# Patient Record
Sex: Male | Born: 1975 | Race: White | Hispanic: No | Marital: Married | Smoking: Former smoker
Health system: Southern US, Community
[De-identification: ages and names within clinical notes are randomized; demographics above are authoritative.]

## PROBLEM LIST (undated history)

## (undated) DIAGNOSIS — Z8619 Personal history of other infectious and parasitic diseases: Secondary | ICD-10-CM

## (undated) DIAGNOSIS — K219 Gastro-esophageal reflux disease without esophagitis: Secondary | ICD-10-CM

## (undated) HISTORY — DX: Personal history of other infectious and parasitic diseases: Z86.19

## (undated) HISTORY — DX: Gastro-esophageal reflux disease without esophagitis: K21.9

## (undated) HISTORY — PX: VASECTOMY: SHX75

---

## 2004-02-13 HISTORY — PX: FRACTURE SURGERY: SHX138

## 2005-02-11 ENCOUNTER — Observation Stay (HOSPITAL_COMMUNITY): Admission: EM | Admit: 2005-02-11 | Discharge: 2005-02-11 | Payer: Self-pay | Admitting: Emergency Medicine

## 2011-04-11 ENCOUNTER — Other Ambulatory Visit: Payer: Self-pay | Admitting: *Deleted

## 2013-08-13 ENCOUNTER — Encounter: Payer: Self-pay | Admitting: Family Medicine

## 2013-08-13 ENCOUNTER — Ambulatory Visit (INDEPENDENT_AMBULATORY_CARE_PROVIDER_SITE_OTHER): Payer: BC Managed Care – PPO | Admitting: Family Medicine

## 2013-08-13 VITALS — BP 122/86 | HR 91 | Temp 98.8°F | Ht 72.0 in | Wt 200.5 lb

## 2013-08-13 DIAGNOSIS — M533 Sacrococcygeal disorders, not elsewhere classified: Secondary | ICD-10-CM

## 2013-08-13 DIAGNOSIS — K219 Gastro-esophageal reflux disease without esophagitis: Secondary | ICD-10-CM

## 2013-08-13 MED ORDER — OMEPRAZOLE 20 MG PO CPDR: 20.0000 mg | DELAYED_RELEASE_CAPSULE | Freq: Every day | ORAL | Status: DC

## 2013-08-13 NOTE — Progress Notes (Signed)
Pre visit review using our clinic review tool, if applicable. No additional management support is needed unless otherwise documented below in the visit note.  ~3 months of pain near the tailbone.  Can get better with repositioning.  Prolonged sitting at work.  Worse as the workdays goes on.  Variable amount of pain.  Pain is superior to the rectum.  He doesn't think it is point tender.  No rash.  Midline pain.  Sitting on horseshoe pillow helps.  No hemorrhoids.  No blood in stool.  No abnormal stools.  No pain in legs.  No burning with urination.  No diarrhea, no fevers.  Not taking any nsaids.    "Heartburn".  Clearly better with PPI.  Worse when comes off.  Some burning in his throat.  Some sensation of gurgling in his throat.  Occ taste changes.  Voice can get irritated.  Going on for years.  Prev would use PPI rarely with relief, now using it more often during the past year.    Meds, vitals, and allergies reviewed.   ROS: See HPI.  Otherwise, noncontributory.  GEN: nad, alert and oriented HEENT: mucous membranes moist NECK: supple w/o LA CV: rrr.  PULM: ctab, no inc wob ABD: soft, +bs EXT: no edema SKIN: no acute rash Gait wnl Slightly ttp at the coccyx in midline, clearly superior to the rectum.

## 2013-08-13 NOTE — Patient Instructions (Signed)
Use the horseshoe pillow consistently, tylenol for pain. If not better, then notify me.   Prilosec daily, taper off coffee and caffeine, limit alcohol.  Elevate the head of your bed.  You can try to taper off the prilosec in about 2 months, going to every other day, then every 3rd day.  Take care.

## 2013-08-16 DIAGNOSIS — K219 Gastro-esophageal reflux disease without esophagitis: Secondary | ICD-10-CM | POA: Insufficient documentation

## 2013-08-16 DIAGNOSIS — M533 Sacrococcygeal disorders, not elsewhere classified: Secondary | ICD-10-CM | POA: Insufficient documentation

## 2013-08-16 NOTE — Assessment & Plan Note (Signed)
Benign exam, no red flag sx.  D/w pt.  If continued pain with persistent use of pillow, then we can image and w/u but this should resolve in the meantime.  He agrees.  No need to image today.

## 2013-08-16 NOTE — Assessment & Plan Note (Signed)
Drinking beer and coffee, head of bed not elevated. With sx of LPR with voice/throat irritation off PPI. Continue PPI for now, taper caffeine and etoh, elevate head of bed, report back as needed. He agrees.  Will try to wean off PPI as lifestyle changes made.  OP exam wnl, no LA on exam.

## 2013-09-24 ENCOUNTER — Ambulatory Visit (INDEPENDENT_AMBULATORY_CARE_PROVIDER_SITE_OTHER)
Admission: RE | Admit: 2013-09-24 | Discharge: 2013-09-24 | Disposition: A | Discharge status: Home / Self Care | Payer: BC Managed Care – PPO | Source: Ambulatory Visit | Attending: Family Medicine | Admitting: Family Medicine

## 2013-09-24 ENCOUNTER — Encounter: Payer: Self-pay | Admitting: Family Medicine

## 2013-09-24 ENCOUNTER — Ambulatory Visit (INDEPENDENT_AMBULATORY_CARE_PROVIDER_SITE_OTHER): Payer: BC Managed Care – PPO | Admitting: Family Medicine

## 2013-09-24 VITALS — BP 128/88 | HR 79 | Temp 98.0°F | Wt 205.2 lb

## 2013-09-24 DIAGNOSIS — M533 Sacrococcygeal disorders, not elsewhere classified: Secondary | ICD-10-CM

## 2013-09-24 NOTE — Progress Notes (Signed)
Pre visit review using our clinic review tool, if applicable. No additional management support is needed unless otherwise documented below in the visit note.  Down to QOD PPI and doing well.  No new injury but tailbone pain continues.  Using a pillow, has been careful, but still with pain.  Pain is similar to prev, in the same location.  No fevers, vomiting, blood in stool. Less pain in AM, more at the day goes on.   Meds, vitals, and allergies reviewed.   ROS: See HPI.  Otherwise, noncontributory.  nad ttp in midline w/o mass noted, superior to the rectum.

## 2013-09-24 NOTE — Patient Instructions (Signed)
Tylenol vs ibuprofen as needed. Ibuprofen with food; if may make the heartburn worse.  Continue with the pillow.  Activity as tolerated.

## 2013-09-25 NOTE — Assessment & Plan Note (Signed)
Images reviewed, d/w pt.  See notes on imaging.  fx noted.  Supportive care. No change in mgmt. Should slowly resolve.  F/u prn.

## 2013-12-01 ENCOUNTER — Encounter: Payer: Self-pay | Admitting: Family Medicine

## 2013-12-01 ENCOUNTER — Ambulatory Visit (INDEPENDENT_AMBULATORY_CARE_PROVIDER_SITE_OTHER): Payer: BC Managed Care – PPO | Admitting: Family Medicine

## 2013-12-01 ENCOUNTER — Encounter (INDEPENDENT_AMBULATORY_CARE_PROVIDER_SITE_OTHER): Payer: Self-pay

## 2013-12-01 VITALS — BP 120/80 | HR 86 | Temp 98.0°F | Ht 72.0 in | Wt 205.0 lb

## 2013-12-01 DIAGNOSIS — Z23 Encounter for immunization: Secondary | ICD-10-CM

## 2013-12-01 DIAGNOSIS — Z7189 Other specified counseling: Secondary | ICD-10-CM

## 2013-12-01 DIAGNOSIS — Z131 Encounter for screening for diabetes mellitus: Secondary | ICD-10-CM

## 2013-12-01 DIAGNOSIS — Z13220 Encounter for screening for lipoid disorders: Secondary | ICD-10-CM

## 2013-12-01 DIAGNOSIS — Z0000 Encounter for general adult medical examination without abnormal findings: Secondary | ICD-10-CM

## 2013-12-01 NOTE — Progress Notes (Signed)
Pre visit review using our clinic review tool, if applicable. No additional management support is needed unless otherwise documented below in the visit note.  CPE- See plan.  Routine anticipatory guidance given to patient.  See health maintenance. Tetanus <10 years per patient, he'll check his old records and notify me.   Flu shot d/w pt.  Done today.  Prostate and colon cancer screening not due.  Diet and exercise d/w pt.  Working on diet, cut back on calories, less fast food.  Exercise is limited.  D/w pt.  Encouraged.  Has a treadmill at home.   Living will d/w pt.  Wife designated if patient were incapacitated.  Due for screening labs, will return when fasting.   PMH and SH reviewed  Meds, vitals, and allergies reviewed.   ROS: See HPI.  Otherwise negative.    GEN: nad, alert and oriented HEENT: mucous membranes moist NECK: supple w/o LA CV: rrr. PULM: ctab, no inc wob ABD: soft, +bs EXT: no edema SKIN: no acute rash

## 2013-12-01 NOTE — Patient Instructions (Signed)
Take care.  Glad to see you.   Check your records about a tetanus shot.  Notify me with a date/year.  Keep working on [employer] Holdingsyour diet.  Try to exercise a little more.  Return for fasting labs.

## 2013-12-02 ENCOUNTER — Other Ambulatory Visit (INDEPENDENT_AMBULATORY_CARE_PROVIDER_SITE_OTHER): Payer: BC Managed Care – PPO

## 2013-12-02 DIAGNOSIS — Z131 Encounter for screening for diabetes mellitus: Secondary | ICD-10-CM

## 2013-12-02 DIAGNOSIS — Z0000 Encounter for general adult medical examination without abnormal findings: Secondary | ICD-10-CM | POA: Insufficient documentation

## 2013-12-02 DIAGNOSIS — Z7189 Other specified counseling: Secondary | ICD-10-CM | POA: Insufficient documentation

## 2013-12-02 DIAGNOSIS — Z13220 Encounter for screening for lipoid disorders: Secondary | ICD-10-CM

## 2013-12-02 LAB — LIPID PANEL
Cholesterol: 207 mg/dL — ABNORMAL HIGH (ref 0–200)
HDL: 40.3 mg/dL (ref >39.00)
NonHDL: 166.7
Total CHOL/HDL Ratio: 5
Triglycerides: 507 mg/dL — ABNORMAL HIGH (ref 0.0–149.0)
VLDL: 101.4 mg/dL — ABNORMAL HIGH (ref 0.0–40.0)

## 2013-12-02 LAB — GLUCOSE, RANDOM: Glucose, Bld: 99 mg/dL (ref 70–99)

## 2013-12-02 NOTE — Assessment & Plan Note (Signed)
Routine anticipatory guidance given to patient.  See health maintenance. Tetanus <10 years per patient, he'll check his old records and notify me.   Flu shot d/w pt.  Done today.  Prostate and colon cancer screening not due.  Diet and exercise d/w pt.  Working on diet, cut back on calories, less fast food.  Exercise is limited.  D/w pt.  Encouraged.  Has a treadmill at home.   Living will d/w pt.  Wife designated if patient were incapacitated.  Due for screening labs, will return when fasting.

## 2013-12-02 NOTE — Assessment & Plan Note (Signed)
Living will d/w pt.  Wife designated if patient were incapacitated.   ?

## 2013-12-03 LAB — LDL CHOLESTEROL, DIRECT: Direct LDL: 97.8 mg/dL

## 2013-12-04 ENCOUNTER — Encounter: Payer: Self-pay | Admitting: Family Medicine

## 2013-12-04 ENCOUNTER — Other Ambulatory Visit: Payer: Self-pay | Admitting: Family Medicine

## 2013-12-04 DIAGNOSIS — E781 Pure hyperglyceridemia: Secondary | ICD-10-CM | POA: Insufficient documentation

## 2013-12-05 ENCOUNTER — Encounter: Payer: Self-pay | Admitting: Family Medicine

## 2013-12-06 ENCOUNTER — Other Ambulatory Visit: Payer: Self-pay | Admitting: Family Medicine

## 2013-12-06 DIAGNOSIS — E781 Pure hyperglyceridemia: Secondary | ICD-10-CM

## 2013-12-07 ENCOUNTER — Telehealth: Payer: Self-pay | Admitting: Family Medicine

## 2013-12-07 NOTE — Telephone Encounter (Signed)
Patient is asking for you to release the lab results from last week to my chart, so he can view them.

## 2013-12-07 NOTE — Telephone Encounter (Signed)
Message to release labs to mychart. Should be viewable now.  Thanks.

## 2013-12-09 ENCOUNTER — Other Ambulatory Visit: Payer: Self-pay | Admitting: Family Medicine

## 2013-12-09 ENCOUNTER — Other Ambulatory Visit (INDEPENDENT_AMBULATORY_CARE_PROVIDER_SITE_OTHER): Payer: BC Managed Care – PPO

## 2013-12-09 DIAGNOSIS — E781 Pure hyperglyceridemia: Secondary | ICD-10-CM

## 2013-12-09 LAB — LIPID PANEL
Cholesterol: 199 mg/dL (ref 0–200)
HDL: 35.2 mg/dL — ABNORMAL LOW (ref >39.00)
NonHDL: 163.8
Total CHOL/HDL Ratio: 6
Triglycerides: 240 mg/dL — ABNORMAL HIGH (ref 0.0–149.0)
VLDL: 48 mg/dL — ABNORMAL HIGH (ref 0.0–40.0)

## 2013-12-09 LAB — LDL CHOLESTEROL, DIRECT: Direct LDL: 123.7 mg/dL

## 2013-12-09 LAB — GLUCOSE, RANDOM: Glucose, Bld: 107 mg/dL — ABNORMAL HIGH (ref 70–99)

## 2014-06-02 ENCOUNTER — Other Ambulatory Visit (INDEPENDENT_AMBULATORY_CARE_PROVIDER_SITE_OTHER): Payer: BLUE CROSS/BLUE SHIELD

## 2014-06-02 DIAGNOSIS — E781 Pure hyperglyceridemia: Secondary | ICD-10-CM | POA: Diagnosis not present

## 2014-06-02 LAB — LIPID PANEL
Cholesterol: 186 mg/dL (ref 0–200)
HDL: 43.4 mg/dL (ref >39.00)
NonHDL: 142.6
Total CHOL/HDL Ratio: 4
Triglycerides: 227 mg/dL — ABNORMAL HIGH (ref 0.0–149.0)
VLDL: 45.4 mg/dL — ABNORMAL HIGH (ref 0.0–40.0)

## 2014-06-02 LAB — LDL CHOLESTEROL, DIRECT: Direct LDL: 113 mg/dL

## 2014-06-02 LAB — GLUCOSE, RANDOM: Glucose, Bld: 91 mg/dL (ref 70–99)

## 2014-06-07 ENCOUNTER — Encounter: Payer: Self-pay | Admitting: *Deleted

## 2015-06-01 ENCOUNTER — Encounter: Payer: Self-pay | Admitting: Family Medicine

## 2015-06-01 ENCOUNTER — Other Ambulatory Visit: Payer: Self-pay | Admitting: Family Medicine

## 2015-06-01 DIAGNOSIS — Z3009 Encounter for other general counseling and advice on contraception: Secondary | ICD-10-CM

## 2016-12-11 ENCOUNTER — Encounter: Payer: BLUE CROSS/BLUE SHIELD | Admitting: Family Medicine

## 2016-12-18 ENCOUNTER — Ambulatory Visit (INDEPENDENT_AMBULATORY_CARE_PROVIDER_SITE_OTHER): Payer: BLUE CROSS/BLUE SHIELD | Admitting: Family Medicine

## 2016-12-18 ENCOUNTER — Encounter: Payer: Self-pay | Admitting: Family Medicine

## 2016-12-18 VITALS — BP 118/72 | HR 72 | Temp 98.8°F | Wt 172.5 lb

## 2016-12-18 DIAGNOSIS — N5089 Other specified disorders of the male genital organs: Secondary | ICD-10-CM

## 2016-12-18 DIAGNOSIS — I459 Conduction disorder, unspecified: Secondary | ICD-10-CM | POA: Diagnosis not present

## 2016-12-18 DIAGNOSIS — Z0000 Encounter for general adult medical examination without abnormal findings: Secondary | ICD-10-CM

## 2016-12-18 DIAGNOSIS — Z23 Encounter for immunization: Secondary | ICD-10-CM

## 2016-12-18 DIAGNOSIS — Z7189 Other specified counseling: Secondary | ICD-10-CM

## 2016-12-18 NOTE — Progress Notes (Signed)
CPE- See plan.  Routine anticipatory guidance given to patient.  See health maintenance.  The possibility exists that previously documented standard health maintenance information may have been brought forward from a previous encounter into this note.  If needed, that same information has been updated to reflect the current situation based on today's encounter.    New patient.  Not seen since 12/01/16.   Tetanus 2018.   Flu shot d/w pt.  Done today.  Prostate and colon cancer screening not due.  Diet and exercise d/w pt. Intentional weight loss.   Living will d/w pt.  Wife designated if patient were incapacitated.  [facility].   HIV screening also done mid 2000s out of clinic.  D/w pt.   About 8 years ago had an occ skipped heart beat, wore a holter monitor.  Per patient, w/u was neg with holter.  No syncope.  Higher stress in the last month at work.  He cut back on caffeine and cut out etoh.  But he is still having skipped/extra beats and episodes can last a few hours.  No heart racing.  No CP, not SOB.    He had a vasectomy done last year.  He still has some discomfort on the L testicle.  He felt some possible extra tissue on/near the L testicle.  He thought it was occ changing in size.  No other urinary sx.   PMH and SH reviewed  Meds, vitals, and allergies reviewed.   ROS: Per HPI.  Unless specifically indicated otherwise in HPI, the patient denies:  General: fever. Eyes: acute vision changes ENT: sore throat Cardiovascular: chest pain Respiratory: SOB GI: vomiting GU: dysuria Musculoskeletal: acute back pain Derm: acute rash Neuro: acute motor dysfunction Psych: worsening mood Endocrine: polydipsia Heme: bleeding Allergy: hayfever  GEN: nad, alert and oriented HEENT: mucous membranes moist NECK: supple w/o LA CV: rrr. PULM: ctab, no inc wob ABD: soft, +bs EXT: no edema SKIN: no acute rash Testes bilaterally descended without  nodularity, tenderness or masses on the R side but some thickening of tissue near the L testicle. No scrotal masses or lesions. No penis lesions or urethral discharge.  EKG wnl.  D/w pt at OV.

## 2016-12-18 NOTE — Patient Instructions (Addendum)
[name] will call about your referral. We'll contact you with your lab report. If your labs are fine, then we'll likely set you up with cardiology.  Limit caffeine in the meantime.   Take care.  Glad to see you.

## 2016-12-19 DIAGNOSIS — I459 Conduction disorder, unspecified: Secondary | ICD-10-CM | POA: Insufficient documentation

## 2016-12-19 DIAGNOSIS — N5089 Other specified disorders of the male genital organs: Secondary | ICD-10-CM | POA: Insufficient documentation

## 2016-12-19 LAB — COMPREHENSIVE METABOLIC PANEL
ALT: 19 U/L (ref 0–53)
AST: 15 U/L (ref 0–37)
Albumin: 4.5 g/dL (ref 3.5–5.2)
Alkaline Phosphatase: 55 U/L (ref 39–117)
BUN: 12 mg/dL (ref 6–23)
CO2: 29 mEq/L (ref 19–32)
Calcium: 9.4 mg/dL (ref 8.4–10.5)
Chloride: 103 mEq/L (ref 96–112)
Creatinine, Ser: 1.02 mg/dL (ref 0.40–1.50)
GFR: 85.26 mL/min (ref >60.00)
Glucose, Bld: 89 mg/dL (ref 70–99)
Potassium: 4.1 mEq/L (ref 3.5–5.1)
Sodium: 139 mEq/L (ref 135–145)
Total Bilirubin: 0.9 mg/dL (ref 0.2–1.2)
Total Protein: 7.2 g/dL (ref 6.0–8.3)

## 2016-12-19 LAB — CBC WITH DIFFERENTIAL/PLATELET
Basophils Absolute: 0.1 10*3/uL (ref 0.0–0.1)
Basophils Relative: 1.1 % (ref 0.0–3.0)
Eosinophils Absolute: 0.2 10*3/uL (ref 0.0–0.7)
Eosinophils Relative: 1.9 % (ref 0.0–5.0)
HCT: 47.2 % (ref 39.0–52.0)
Hemoglobin: 16.1 g/dL (ref 13.0–17.0)
Lymphocytes Relative: 31.4 % (ref 12.0–46.0)
Lymphs Abs: 2.5 10*3/uL (ref 0.7–4.0)
MCHC: 34.1 g/dL (ref 30.0–36.0)
MCV: 94.4 fl (ref 78.0–100.0)
Monocytes Absolute: 0.7 10*3/uL (ref 0.1–1.0)
Monocytes Relative: 8.2 % (ref 3.0–12.0)
Neutro Abs: 4.6 10*3/uL (ref 1.4–7.7)
Neutrophils Relative %: 57.4 % (ref 43.0–77.0)
Platelets: 261 10*3/uL (ref 150.0–400.0)
RBC: 4.99 Mil/uL (ref 4.22–5.81)
RDW: 12.7 % (ref 11.5–15.5)
WBC: 8 10*3/uL (ref 4.0–10.5)

## 2016-12-19 LAB — TSH: TSH: 2.05 u[IU]/mL (ref 0.35–4.50)

## 2016-12-19 NOTE — Assessment & Plan Note (Signed)
No reason to suspect ominous process.  Likely scar tissue from previous vasectomy.  Could have developed a spermatocele or hydrocele.  Discussed with patient.  Will check ultrasound.  See notes on imaging when resulted.  He agrees with plan.

## 2016-12-19 NOTE — Assessment & Plan Note (Signed)
  Tetanus 2018.   Flu shot d/w pt.  Done today.  Prostate and colon cancer screening not due.  Diet and exercise d/w pt. Intentional weight loss.   Living will d/w pt.  Wife designated if patient were incapacitated.  [facility].   HIV screening also done mid 2000s out of clinic.  D/w pt.

## 2016-12-19 NOTE — Assessment & Plan Note (Signed)
Living will d/w pt.  Wife designated if patient were incapacitated.   ?

## 2016-12-19 NOTE — Addendum Note (Signed)
Addended by: Sydell AxonLAWS, REGINA C on: 12/19/2016 02:37 PM   Modules accepted: Orders

## 2016-12-19 NOTE — Assessment & Plan Note (Signed)
From his description, it sounds like he is having episodes of trigeminy or similar.  No chest pain.  No shortness of breath.  No runs of tachycardia.  Okay for outpatient follow-up.  Continue to avoid alcohol.  Taper caffeine.  EKG unremarkable.  See notes on labs.  Could be the job stressors are contributing.  If labs are unremarkable then referred to cardiology since his EKG was normal and did not demonstrate abnormality.  He may end up needing a repeat Holter monitor.  Given his relatively low blood pressure and heart rate, I did not start a beta-blocker today.  Discussed with patient.  He agrees.

## 2016-12-20 ENCOUNTER — Other Ambulatory Visit: Payer: Self-pay | Admitting: Family Medicine

## 2016-12-20 DIAGNOSIS — N5089 Other specified disorders of the male genital organs: Secondary | ICD-10-CM

## 2016-12-20 DIAGNOSIS — I459 Conduction disorder, unspecified: Secondary | ICD-10-CM

## 2016-12-25 ENCOUNTER — Ambulatory Visit
Admission: RE | Admit: 2016-12-25 | Discharge: 2016-12-25 | Disposition: A | Discharge status: Home / Self Care | Payer: BLUE CROSS/BLUE SHIELD | Source: Ambulatory Visit | Attending: Family Medicine | Admitting: Family Medicine

## 2016-12-25 DIAGNOSIS — N5089 Other specified disorders of the male genital organs: Secondary | ICD-10-CM

## 2016-12-25 NOTE — Addendum Note (Signed)
Addended by: Sydell AxonLAWS, REGINA C on: 12/25/2016 08:34 AM   Modules accepted: Orders

## 2017-01-10 ENCOUNTER — Encounter: Payer: Self-pay | Admitting: Cardiovascular Disease

## 2017-01-10 ENCOUNTER — Ambulatory Visit (INDEPENDENT_AMBULATORY_CARE_PROVIDER_SITE_OTHER): Payer: BLUE CROSS/BLUE SHIELD | Admitting: Cardiovascular Disease

## 2017-01-10 VITALS — BP 110/76 | HR 78 | Ht 72.0 in | Wt 172.4 lb

## 2017-01-10 DIAGNOSIS — R002 Palpitations: Secondary | ICD-10-CM | POA: Diagnosis not present

## 2017-01-10 DIAGNOSIS — E785 Hyperlipidemia, unspecified: Secondary | ICD-10-CM | POA: Diagnosis not present

## 2017-01-10 MED ORDER — METOPROLOL TARTRATE 25 MG PO TABS: ORAL_TABLET | ORAL | 33 refills | Status: AC

## 2017-01-10 NOTE — Progress Notes (Deleted)
Cardiology Office Note    Date:  01/10/2017   ID:  Boston Service, DOB [birth year], MRN [id number]  PCP:  Tonia Ghent, MD  Cardiologist:  Shelva Majestic, MD   No chief complaint on file.   History of Present Illness:  Jason English is a 41 y.o. male     Past Medical History:  Diagnosis Date  . GERD (gastroesophageal reflux disease)   . History of chicken pox     Past Surgical History:  Procedure Laterality Date  . FRACTURE SURGERY  2006   arm ([place])  . VASECTOMY      Current Medications: No outpatient medications prior to visit.   No facility-administered medications prior to visit.      Allergies:   Amoxicillin   Social History   Socioeconomic History  . Marital status: Married    Spouse name: None  . Number of children: None  . Years of education: None  . Highest education level: None  Social Needs  . Financial resource strain: None  . Food insecurity - worry: None  . Food insecurity - inability: None  . Transportation needs - medical: None  . Transportation needs - non-medical: None  Occupational History  . 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 Smoking status: Former Smoker    Types: Cigarettes  . Smokeless tobacco: Never Used  Substance and Sexual Activity  . Alcohol use: No    Frequency: Never  . Drug use: No  . Sexual activity: None  Other Topics Concern  . None  Social History Narrative   BS at [employer].   Insurance underwriter at [employer].     Married 2008   No kids     family history includes Stroke in his other.   ROS General: Negative; No fevers, chills, or night sweats;  HEENT: Negative; No changes in vision or hearing, sinus congestion, difficulty swallowing Pulmonary: Negative; No cough, wheezing, shortness of breath, hemoptysis Cardiovascular: Negative; No chest pain, presyncope, syncope, palpitations GI: Negative; No nausea, vomiting, diarrhea, or abdominal pain GU:  Negative; No dysuria, hematuria, or difficulty voiding Musculoskeletal: Negative; no myalgias, joint pain, or weakness Hematologic/Oncology: Negative; no easy bruising, bleeding Endocrine: Negative; no heat/cold intolerance; no diabetes Neuro: Negative; no changes in balance, headaches Skin: Negative; No rashes or skin lesions Psychiatric: Negative; No behavioral problems, depression Sleep: Negative; No snoring, daytime sleepiness, hypersomnolence, bruxism, restless legs, hypnogognic hallucinations, no cataplexy Other comprehensive 14 point system review is negative.   PHYSICAL EXAM:   VS:  BP 110/76 (BP Location: Right Arm, Patient Position: Sitting, Cuff Size: Normal)   Pulse 78   Ht 6' (1.829 m)   Wt 172 lb 6.4 oz (78.2 kg)   BMI 23.38 kg/m    Wt Readings from Last 3 Encounters:  01/10/17 172 lb 6.4 oz (78.2 kg)  12/18/16 172 lb 8 oz (78.2 kg)  12/01/13 205 lb (93 kg)    General: Alert, oriented, no distress.  Skin: normal turgor, no rashes, warm and dry HEENT: Normocephalic, atraumatic. Pupils equal round and reactive to light; sclera anicteric; extraocular muscles intact; Fundi  Nose without nasal septal hypertrophy Mouth/Parynx benign; Mallinpatti scale Neck: No JVD, no carotid bruits; normal carotid upstroke Lungs: clear to ausculatation and percussion; no wheezing or rales Chest wall: without tenderness to palpitation Heart: PMI not displaced, RRR, s1 s2 normal, 1/6 systolic murmur, no diastolic murmur, no rubs, gallops, thrills, or heaves Abdomen: soft, nontender; no hepatosplenomehaly,  BS+; abdominal aorta nontender and not dilated by palpation. Back: no CVA tenderness Pulses 2+ Musculoskeletal: full range of motion, normal strength, no joint deformities Extremities: no clubbing cyanosis or edema, Homan's sign negative  Neurologic: grossly nonfocal; Cranial nerves grossly wnl Psychologic: Normal mood and affect   Studies/Labs Reviewed:     Recent Labs: BMP  Latest Ref Rng & Units 12/18/2016 06/02/2014 12/09/2013  Glucose 70 - 99 mg/dL 89 91 107(H)  BUN 6 - 23 mg/dL 12 - -  Creatinine 0.40 - 1.50 mg/dL 1.02 - -  Sodium 135 - 145 mEq/L 139 - -  Potassium 3.5 - 5.1 mEq/L 4.1 - -  Chloride 96 - 112 mEq/L 103 - -  CO2 19 - 32 mEq/L 29 - -  Calcium 8.4 - 10.5 mg/dL 9.4 - -     Hepatic Function Latest Ref Rng & Units 12/18/2016  Total Protein 6.0 - 8.3 g/dL 7.2  Albumin 3.5 - 5.2 g/dL 4.5  AST 0 - 37 U/L 15  ALT 0 - 53 U/L 19  Alk Phosphatase 39 - 117 U/L 55  Total Bilirubin 0.2 - 1.2 mg/dL 0.9    CBC Latest Ref Rng & Units 12/18/2016  WBC 4.0 - 10.5 K/uL 8.0  Hemoglobin 13.0 - 17.0 g/dL 16.1  Hematocrit 39.0 - 52.0 % 47.2  Platelets 150.0 - 400.0 K/uL 261.0   Lab Results  Component Value Date   MCV 94.4 12/18/2016   Lab Results  Component Value Date   TSH 2.05 12/18/2016   No results found for: HGBA1C   BNP No results found for: BNP  ProBNP No results found for: PROBNP   Lipid Panel     Component Value Date/Time   CHOL 186 06/02/2014 0808   TRIG 227.0 (H) 06/02/2014 0808   HDL 43.40 06/02/2014 0808   CHOLHDL 4 06/02/2014 0808   VLDL 45.4 (H) 06/02/2014 0808   LDLDIRECT 113.0 06/02/2014 0808     RADIOLOGY: [place] Scrotom W/doppler  Result Date: 12/25/2016 CLINICAL DATA:  Initial evaluation for left testicular mass. EXAM: SCROTAL ULTRASOUND DOPPLER ULTRASOUND OF THE TESTICLES TECHNIQUE: Complete ultrasound examination of the testicles, epididymis, and other scrotal structures was performed. Color and spectral Doppler ultrasound were also utilized to evaluate blood flow to the testicles. COMPARISON:  None. FINDINGS: Right testicle Measurements: 4.3 x 1.9 x 2.9 cm. No mass or microlithiasis visualized. Left testicle Measurements: 3.5 x 1.8 x 2.9 cm. No mass or microlithiasis visualized. Right epididymis:  Normal in size and appearance. Left epididymis:  Normal in size and appearance. Hydrocele:  None visualized. Varicocele:   None visualized. Pulsed Doppler interrogation of both testes demonstrates normal low resistance arterial and venous waveforms bilaterally. IMPRESSION: Normal testicular ultrasound. No mass lesion or other abnormality identified. Electronically Signed   By: Jeannine Boga M.D.   On: 12/25/2016 21:08         ASSESSMENT:    No diagnosis found.   PLAN:     Medication Adjustments/Labs and Tests Ordered: Current medicines are reviewed at length with the patient today.  Concerns regarding medicines are outlined above.  Medication changes, Labs and Tests ordered today are listed in the Patient Instructions below. There are no Patient Instructions on file for this visit.   Signed, Shelva Majestic, MD  01/10/2017 2:16 PM    [facility] [address], [place], [place], [place]  [address] Phone: [phone]

## 2017-01-10 NOTE — Progress Notes (Signed)
Cardiology Office Note    Date:  01/11/2017   ID:  Jason English, DOB [birth year], MRN [id number]  PCP:  Tonia Ghent, MD  Cardiologist:  Shelva Majestic, MD   Cardiology consultation through the courtesy of Dr. Elsie Stain for evaluation of palpitations  History of Present Illness:  Jason English is a 41 y.o. male who presents for cardiology consultation through the referral of Dr. Elsie Stain relation of palpitations.  Mr. Saric denies any significant medical history.  However, 6 years ago while living in the [place] area.  He had experienced some episodic palpitations.  He apparently underwent a Holter monitor study and he was not found to have significant abnormalities and some rare extra beats.  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  He has noticed some significant increased work-related stress, and with this increased stress.  He has noticed recurrence of palpitations.  Oftentimes he may feel isolated beats, rarely but other times every third or fourth beat and may last for some time.  He is recently eliminated caffeine altogether and has noticed significant benefit in improvement of his ectopy frequency but he still notes this occasionally.  For the past 2 weeks.  He had taken off from work and noted complete resolution of his palpitations.  He returned back to work and again has noticed some recurrence, particularly during increased stressful moments.  He admits to good sleep and sleep 78 hours per night.  He does not snore.  He sleeps the entire night.  His wife is a Marine scientist.  He is uncertain if he develops any increase palpitations with exercise but he states he has not been exercising but is concerned that maybe the extra beats may increase.  He was recently evaluated by Dr. Elsie Stain and he now presents for cardiology consultation.  He denies any chest pain.  He denies any PND orthopnea.  He denies presyncope or  syncope.  An Epworth Sleepiness Scale score was calculated in the office today and this endorsed at 3, arguing against daytime sleepiness.   Past Medical History:  Diagnosis Date  . GERD (gastroesophageal reflux disease)   . History of chicken pox     Past Surgical History:  Procedure Laterality Date  . FRACTURE SURGERY  2006   arm ([place])  . VASECTOMY      Current Medications: No outpatient medications prior to visit.   No facility-administered medications prior to visit.      Allergies:   Amoxicillin   Social History   Socioeconomic History  . Marital status: Married    Spouse name: None  . Number of children: None  . Years of education: None  . Highest education level: None  Social Needs  . Financial resource strain: None  . Food insecurity - worry: None  . Food insecurity - inability: None  . Transportation needs - medical: None  . Transportation needs - non-medical: None  Occupational History  . 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 Smoking status: Former Smoker    Types: Cigarettes  . Smokeless tobacco: Never Used  Substance and Sexual Activity  . Alcohol use: No    Frequency: Never  . Drug use: No  . Sexual activity: None  Other Topics Concern  . None  Social History Narrative   BS at [employer].   Insurance underwriter at [employer].     Married  2008   No kids     Family History:  The patient's family history includes Stroke in his other.  Both parents are alive at 44 years old.  He has one brother age 56.  ROS General: Negative; No fevers, chills, or night sweats;  HEENT: Negative; No changes in vision or hearing, sinus congestion, difficulty swallowing Pulmonary: Negative; No cough, wheezing, shortness of breath, hemoptysis Cardiovascular: See history of present illness GI: Negative; No nausea, vomiting, diarrhea, or abdominal pain GU: Negative; No dysuria, hematuria, or difficulty  voiding Musculoskeletal: Negative; no myalgias, joint pain, or weakness Hematologic/Oncology: Negative; no easy bruising, bleeding Endocrine: Negative; no heat/cold intolerance; no diabetes Neuro: Negative; no changes in balance, headaches Skin: Negative; No rashes or skin lesions Psychiatric: Negative; No behavioral problems, depression Sleep: Negative; No snoring, daytime sleepiness, hypersomnolence, bruxism, restless legs, hypnogognic hallucinations, no cataplexy Other comprehensive 14 point system review is negative.   PHYSICAL EXAM:   VS:  BP 110/76 (BP Location: Right Arm, Patient Position: Sitting, Cuff Size: Normal)   Pulse 78   Ht 6' (1.829 m)   Wt 172 lb 6.4 oz (78.2 kg)   BMI 23.38 kg/m     Repeat blood pressure by me was 112/76 supine and 114/74 standing  Wt Readings from Last 3 Encounters:  01/10/17 172 lb 6.4 oz (78.2 kg)  12/18/16 172 lb 8 oz (78.2 kg)  12/01/13 205 lb (93 kg)    General: Alert, oriented, no distress.  Skin: normal turgor, no rashes, warm and dry HEENT: Normocephalic, atraumatic. Pupils equal round and reactive to light; sclera anicteric; extraocular muscles intact; Fundi without hemorrhages or exudates.  Disks flat. Nose without nasal septal hypertrophy Mouth/Parynx benign; Mallinpatti scale 2 Neck: No JVD, no carotid bruits; normal carotid upstroke Lungs: clear to ausculatation and percussion; no wheezing or rales Chest wall: without tenderness to palpitation Heart: PMI not displaced, RRR, s1 s2 normal, trace 1/6 systolic murmur, no diastolic murmur, no rubs, gallops, thrills, or heaves; no ectopy on prolonged auscultation Abdomen: soft, nontender; no hepatosplenomehaly, BS+; abdominal aorta nontender and not dilated by palpation. Back: no CVA tenderness Pulses 2+ Musculoskeletal: full range of motion, normal strength, no joint deformities Extremities: no clubbing cyanosis or edema, Homan's sign negative  Neurologic: grossly nonfocal; Cranial  nerves grossly wnl Psychologic: Normal mood and affect   Studies/Labs Reviewed:   EKG:  EKG is ordered today. ECG (independently read by me): Normal sinus rhythm at 70 bpm.  Normal intervals. No ST T  changes.  No ectopy.  Recent Labs: BMP Latest Ref Rng & Units 12/18/2016 06/02/2014 12/09/2013  Glucose 70 - 99 mg/dL 89 91 107(H)  BUN 6 - 23 mg/dL 12 - -  Creatinine 0.40 - 1.50 mg/dL 1.02 - -  Sodium 135 - 145 mEq/L 139 - -  Potassium 3.5 - 5.1 mEq/L 4.1 - -  Chloride 96 - 112 mEq/L 103 - -  CO2 19 - 32 mEq/L 29 - -  Calcium 8.4 - 10.5 mg/dL 9.4 - -     Hepatic Function Latest Ref Rng & Units 12/18/2016  Total Protein 6.0 - 8.3 g/dL 7.2  Albumin 3.5 - 5.2 g/dL 4.5  AST 0 - 37 U/L 15  ALT 0 - 53 U/L 19  Alk Phosphatase 39 - 117 U/L 55  Total Bilirubin 0.2 - 1.2 mg/dL 0.9    CBC Latest Ref Rng & Units 12/18/2016  WBC 4.0 - 10.5 K/uL 8.0  Hemoglobin 13.0 - 17.0 g/dL 16.1  Hematocrit 39.0 -  52.0 % 47.2  Platelets 150.0 - 400.0 K/uL 261.0   Lab Results  Component Value Date   MCV 94.4 12/18/2016   Lab Results  Component Value Date   TSH 2.05 12/18/2016   No results found for: HGBA1C   BNP No results found for: BNP  ProBNP No results found for: PROBNP   Lipid Panel     Component Value Date/Time   CHOL 186 06/02/2014 0808   TRIG 227.0 (H) 06/02/2014 0808   HDL 43.40 06/02/2014 0808   CHOLHDL 4 06/02/2014 0808   VLDL 45.4 (H) 06/02/2014 0808   LDLDIRECT 113.0 06/02/2014 0808     RADIOLOGY: [place] Scrotom W/doppler  Result Date: 12/25/2016 CLINICAL DATA:  Initial evaluation for left testicular mass. EXAM: SCROTAL ULTRASOUND DOPPLER ULTRASOUND OF THE TESTICLES TECHNIQUE: Complete ultrasound examination of the testicles, epididymis, and other scrotal structures was performed. Color and spectral Doppler ultrasound were also utilized to evaluate blood flow to the testicles. COMPARISON:  None. FINDINGS: Right testicle Measurements: 4.3 x 1.9 x 2.9 cm. No mass or  microlithiasis visualized. Left testicle Measurements: 3.5 x 1.8 x 2.9 cm. No mass or microlithiasis visualized. Right epididymis:  Normal in size and appearance. Left epididymis:  Normal in size and appearance. Hydrocele:  None visualized. Varicocele:  None visualized. Pulsed Doppler interrogation of both testes demonstrates normal low resistance arterial and venous waveforms bilaterally. IMPRESSION: Normal testicular ultrasound. No mass lesion or other abnormality identified. Electronically Signed   By: Jeannine Boga M.D.   On: 12/25/2016 21:08     Additional studies/ records that were reviewed today include:  I reviewed the medical records from Dr. Elsie Stain.  I reviewed recent laboratory.    ASSESSMENT:    1. Palpitations   2. Mild hyperlipidemia      PLAN:  Mr. Damarri Rampy is a 41 year old Caucasian male who has a history in the past.  Of previously diagnosed palpitations.  A Holter monitor at that time did not show any significant abnormality.  He believes this was done in the [place] area.  Noticed that his palpitations seem to be more stress mediated and also were affected by caffeine use.  With complete elimination of caffeine.  These have resolved but still occur particularly during stressful periods.  Of note, during a 2 week holiday from work, he did not experience any palpitations.  There is no history to suggest sleep apnea.  His blood pressure is on the low side and stable.  There is no history of chest tightness or anginal type symptomatology.  I have suggested that he undergo a 2-D echo Doppler study to make certain he does not have any underlying structural heart disease.  In addition, I will also schedule him for a routine graded exercise treadmill test, which will hopefully provide reassurance that he does not have exercise induced ectopy.  At present, I do not feel the need to repeat an event monitor or Holter study unless he develops increasing episodes.  I  suspect this may be secondary to stress mediated hyper adrenergic response.  I'm giving a prescription for metoprolol tartrate to take 12.5 or 25 mg on a PRN basis.  I reviewed recent laboratory.  We discussed some dietary modifications.  His TSH is normal at 2.05.  He had normal electrolytes.  He is not anemic with a hemoglobin of 16.1.  I will see him in 3 months for follow-up evaluation.lipid studies showedan increased LDL at 113, and triglycerides a 27   Medication Adjustments/Labs  and Tests Ordered: Current medicines are reviewed at length with the patient today.  Concerns regarding medicines are outlined above.  Medication changes, Labs and Tests ordered today are listed in the Patient Instructions below. Patient Instructions  Medication Instructions:  Take metoprolol tartrate (Lopressor) 12.5-25 mg (0.5-1 tablet) AS needed for palpitations  Testing/Procedures: Your physician has requested that you have an exercise tolerance test. For further information please visit [email or web address]. Please also follow instruction sheet, as given.  Your physician has requested that you have an echocardiogram. Echocardiography is a painless test that uses sound waves to create images of your heart. It provides your doctor with information about the size and shape of your heart and how well your heart's chambers and valves are working. This procedure takes approximately one hour. There are no restrictions for this procedure. This will be done at our [facility] location:  [place]: Your physician recommends that you schedule a follow-up appointment in: 3 months with Dr. Claiborne Billings.    Any Other Special Instructions Will Be Listed Below (If Applicable).     If you need a refill on your cardiac medications before your next appointment, please call your pharmacy.      Signed, Shelva Majestic, MD  01/11/2017 6:10 AM    [facility] [address] [place], [place], [place]  [address] Phone: [phone]

## 2017-01-10 NOTE — Patient Instructions (Signed)
Medication Instructions:  Take metoprolol tartrate (Lopressor) 12.5-25 mg (0.5-1 tablet) AS needed for palpitations  Testing/Procedures: Your physician has requested that you have an exercise tolerance test. For further information please visit [email or web address] Please also follow instruction sheet, as given.  Your physician has requested that you have an echocardiogram. Echocardiography is a painless test that uses sound waves to create images of your heart. It provides your doctor with information about the size and shape of your heart and how well your heart's chambers and valves are working. This procedure takes approximately one hour. There are no restrictions for this procedure. [facility]  [facility]  Follow-Up: Your physician recommends that you schedule a follow-up appointment in: 3 months with Dr. Tresa EndoKelly.    Any Other Special Instructions Will Be Listed Below (If Applicable).     If you need a refill on your cardiac medications before your next appointment, please call your pharmacy.

## 2017-01-11 ENCOUNTER — Encounter: Payer: Self-pay | Admitting: Cardiovascular Disease

## 2017-01-17 ENCOUNTER — Telehealth (HOSPITAL_COMMUNITY): Payer: Self-pay

## 2017-01-17 NOTE — Telephone Encounter (Signed)
Encounter complete. 

## 2017-01-22 ENCOUNTER — Inpatient Hospital Stay (HOSPITAL_COMMUNITY)
Admission: RE | Admit: 2017-01-22 | Payer: BLUE CROSS/BLUE SHIELD | Source: Ambulatory Visit | Attending: Cardiovascular Disease | Admitting: Cardiovascular Disease

## 2017-01-23 ENCOUNTER — Telehealth (HOSPITAL_COMMUNITY): Payer: Self-pay | Admitting: Cardiovascular Disease

## 2017-01-24 ENCOUNTER — Telehealth (HOSPITAL_COMMUNITY): Payer: Self-pay

## 2017-01-24 NOTE — Telephone Encounter (Signed)
Encounter complete. 

## 2017-01-24 NOTE — Telephone Encounter (Signed)
User: Jason English, Jason English Date/time: 01/23/17 11:12 AM  Comment: Called pt and lmsg for him to CB to r/s appt due to office being closed.   Context:  Outcome: Left Message  Phone number: [phone] Phone Type: Home Phone  Comm. type: Telephone Call type: Outgoing  Contact: Jason English, Jason English Relation to patient: Self

## 2017-01-25 ENCOUNTER — Ambulatory Visit (HOSPITAL_BASED_OUTPATIENT_CLINIC_OR_DEPARTMENT_OTHER): Payer: BLUE CROSS/BLUE SHIELD

## 2017-01-25 ENCOUNTER — Other Ambulatory Visit: Payer: Self-pay

## 2017-01-25 ENCOUNTER — Ambulatory Visit (HOSPITAL_COMMUNITY)
Admission: RE | Admit: 2017-01-25 | Discharge: 2017-01-25 | Disposition: A | Discharge status: Home / Self Care | Payer: BLUE CROSS/BLUE SHIELD | Source: Ambulatory Visit | Attending: Cardiovascular Disease | Admitting: Cardiovascular Disease

## 2017-01-25 DIAGNOSIS — I493 Ventricular premature depolarization: Secondary | ICD-10-CM | POA: Diagnosis not present

## 2017-01-25 DIAGNOSIS — R002 Palpitations: Secondary | ICD-10-CM | POA: Diagnosis present

## 2017-01-25 LAB — EXERCISE TOLERANCE TEST
Estimated workload: 15.1 METS
Exercise duration (min): 12 min
Exercise duration (sec): 55 s
MPHR: 179 {beats}/min
Peak HR: 181 {beats}/min
Percent HR: 101 %
RPE: 16
Rest HR: 71 {beats}/min

## 2017-04-22 ENCOUNTER — Encounter: Payer: Self-pay | Admitting: Cardiovascular Disease

## 2017-04-22 ENCOUNTER — Ambulatory Visit (INDEPENDENT_AMBULATORY_CARE_PROVIDER_SITE_OTHER): Payer: BLUE CROSS/BLUE SHIELD | Admitting: Cardiovascular Disease

## 2017-04-22 VITALS — BP 114/70 | HR 76 | Ht 72.0 in | Wt 165.0 lb

## 2017-04-22 DIAGNOSIS — E785 Hyperlipidemia, unspecified: Secondary | ICD-10-CM

## 2017-04-22 DIAGNOSIS — R002 Palpitations: Secondary | ICD-10-CM | POA: Diagnosis not present

## 2017-04-22 NOTE — Patient Instructions (Signed)
Medication Instructions:  Your physician recommends that you continue on your current medications as directed. Please refer to the Current Medication list given to you today.  Follow-Up: As needed with Dr. Kelly    If you need a refill on your cardiac medications before your next appointment, please call your pharmacy.   

## 2017-04-22 NOTE — Progress Notes (Signed)
Cardiology Office Note    Date:  04/22/2017   ID:  Jason English, DOB 03-Dec-1975, MRN [id number]  PCP:  Tonia Ghent, MD  Cardiologist:  Shelva Majestic, MD   F/U   History of Present Illness:  Jason English is a 42 y.o. male who presents for f/u cardiology evaluation initially referred by Dr. Elsie Stain for evaluation of palpitations.    Jason English denies any significant medical history.  However, 6 years ago while living in the [place] area.  He had experienced some episodic palpitations.  He apparently underwent a Holter monitor study and he was not found to have significant abnormalities and some rare extra beats.  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  He has noticed some significant increased work-related stress, and with this increased stress.  He has noticed recurrence of palpitations.  Oftentimes he may feel isolated beats, rarely but other times every third or fourth beat and may last for some time.  He is recently eliminated caffeine altogether and has noticed significant benefit in improvement of his ectopy frequency but he still notes this occasionally.  For the past 2 weeks.  He had taken off from work and noted complete resolution of his palpitations.  He returned back to work and again has noticed some recurrence, particularly during increased stressful moments.  He admits to good sleep and sleep 78 hours per night.  He does not snore.  He sleeps the entire night.  His wife is a Marine scientist.  He is uncertain if he develops any increase palpitations with exercise but he states he has not been exercising but is concerned that maybe the extra beats may increase.  He was  evaluated by Dr. Elsie Stain and referred to me for initial evaluation on 01/10/2017.  At that time, I recommended he undergo an echo Doppler study and routine treadmill test.  He had normal LV function with an EF of 60-65% and normal wall motion.  He  had normal diastolic parameters.  He had normal valvular architecture.  On treadmill testing, he did not have any chest pain and had good exercise tolerance, exercising for almost 13 minutes and up to a 15.1 met workload.  He was noted to have frequent PVCs without sustained arrhythmias.  There is no evidence for ischemia.  His palpitations have been aggravated by work stress but this has improved.  Since I saw him, he did take metoprolol on one occasion with a solution of symptoms.  He did take 1 He denies any chest pain.  He denies any PND orthopnea.  He denies presyncope or syncope.  An Epworth Sleepiness Scale score  endorsed at 3, arguing against daytime sleepiness.   Past Medical History:  Diagnosis Date  . GERD (gastroesophageal reflux disease)   . History of chicken pox     Past Surgical History:  Procedure Laterality Date  . FRACTURE SURGERY  2006   arm ([place])  . VASECTOMY      Current Medications: Outpatient Medications Prior to Visit  Medication Sig Dispense Refill  . metoprolol tartrate (LOPRESSOR) 25 MG tablet Take 12.89m-25 mg (0.5-1 tablet) AS NEEDED for palpitations 60 tablet 33   No facility-administered medications prior to visit.      Allergies:   Amoxicillin   Social History   Socioeconomic History  . Marital status: Married    Spouse name: None  . Number of children: None  . Years of  education: None  . Highest education level: None  Social Needs  . Financial resource strain: None  . Food insecurity - worry: None  . Food insecurity - inability: None  . Transportation needs - medical: None  . Transportation needs - non-medical: None  Occupational History  . 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 Smoking status: Former Smoker    Types: Cigarettes  . Smokeless tobacco: Never Used  Substance and Sexual Activity  . Alcohol use: No    Frequency: Never  . Drug use: No  . Sexual activity: None  Other Topics  Concern  . None  Social History Narrative   BS at [employer].   Insurance underwriter at [employer].     Married 2008   No kids     Family History:  The patient's family history includes Stroke in his other.  Both parents are alive at 65 years old.  He has one brother age 42.  ROS General: Negative; No fevers, chills, or night sweats;  HEENT: Negative; No changes in vision or hearing, sinus congestion, difficulty swallowing Pulmonary: Negative; No cough, wheezing, shortness of breath, hemoptysis Cardiovascular: See history of present illness GI: Negative; No nausea, vomiting, diarrhea, or abdominal pain GU: Negative; No dysuria, hematuria, or difficulty voiding Musculoskeletal: Negative; no myalgias, joint pain, or weakness Hematologic/Oncology: Negative; no easy bruising, bleeding Endocrine: Negative; no heat/cold intolerance; no diabetes Neuro: Negative; no changes in balance, headaches Skin: Negative; No rashes or skin lesions Psychiatric: Negative; No behavioral problems, depression Sleep: Negative; No snoring, daytime sleepiness, hypersomnolence, bruxism, restless legs, hypnogognic hallucinations, no cataplexy Other comprehensive 14 point system review is negative.   PHYSICAL EXAM:   VS:  BP 114/70   Pulse 76   Ht 6' (1.829 m)   Wt 165 lb (74.8 kg)   BMI 22.38 kg/m     Repeat blood pressure by me was 104/70 supine and 02/16/1968 standing  Wt Readings from Last 3 Encounters:  04/22/17 165 lb (74.8 kg)  01/10/17 172 lb 6.4 oz (78.2 kg)  12/18/16 172 lb 8 oz (78.2 kg)     General: Alert, oriented, no distress.  Skin: normal turgor, no rashes, warm and dry HEENT: Normocephalic, atraumatic. Pupils equal round and reactive to light; sclera anicteric; extraocular muscles intact;  Nose without nasal septal hypertrophy Mouth/Parynx benign; Mallinpatti scale 2 Neck: No JVD, no carotid bruits; normal carotid upstroke Lungs: clear to ausculatation and percussion; no  wheezing or rales Chest wall: without tenderness to palpitation Heart: PMI not displaced, RRR, s1 s2 normal, no systolic murmur, no diastolic murmur, no rubs, gallops, thrills, or heaves Abdomen: soft, nontender; no hepatosplenomehaly, BS+; abdominal aorta nontender and not dilated by palpation. Back: no CVA tenderness Pulses 2+ Musculoskeletal: full range of motion, normal strength, no joint deformities Extremities: no clubbing cyanosis or edema, Homan's sign negative  Neurologic: grossly nonfocal; Cranial nerves grossly wnl Psychologic: Normal mood and affect    Studies/Labs Reviewed:   EKG:  EKG is ordered today.  ECG (independently read by me): Normal sinus rhythm at 78 bpm.  No significant ST-T change.  January 10, 2017 ECG (independently read by me): Normal sinus rhythm at 70 bpm.  Normal intervals. No ST T  changes.  No ectopy.  Recent Labs: BMP Latest Ref Rng & Units 12/18/2016 06/02/2014 12/09/2013  Glucose 70 - 99 mg/dL 89 91 107(H)  BUN 6 - 23 mg/dL 12 - -  Creatinine 0.40 - 1.50 mg/dL  1.02 - -  Sodium 135 - 145 mEq/L 139 - -  Potassium 3.5 - 5.1 mEq/L 4.1 - -  Chloride 96 - 112 mEq/L 103 - -  CO2 19 - 32 mEq/L 29 - -  Calcium 8.4 - 10.5 mg/dL 9.4 - -     Hepatic Function Latest Ref Rng & Units 12/18/2016  Total Protein 6.0 - 8.3 g/dL 7.2  Albumin 3.5 - 5.2 g/dL 4.5  AST 0 - 37 U/L 15  ALT 0 - 53 U/L 19  Alk Phosphatase 39 - 117 U/L 55  Total Bilirubin 0.2 - 1.2 mg/dL 0.9    CBC Latest Ref Rng & Units 12/18/2016  WBC 4.0 - 10.5 K/uL 8.0  Hemoglobin 13.0 - 17.0 g/dL 16.1  Hematocrit 39.0 - 52.0 % 47.2  Platelets 150.0 - 400.0 K/uL 261.0   Lab Results  Component Value Date   MCV 94.4 12/18/2016   Lab Results  Component Value Date   TSH 2.05 12/18/2016   No results found for: HGBA1C   BNP No results found for: BNP  ProBNP No results found for: PROBNP   Lipid Panel     Component Value Date/Time   CHOL 186 06/02/2014 0808   TRIG 227.0 (H)  06/02/2014 0808   HDL 43.40 06/02/2014 0808   CHOLHDL 4 06/02/2014 0808   VLDL 45.4 (H) 06/02/2014 0808   LDLDIRECT 113.0 06/02/2014 0808     RADIOLOGY: No results found.   Additional studies/ records that were reviewed today include:  I reviewed the medical records from Dr. Elsie Stain.  I reviewed recent laboratory.  I reviewed his echo Doppler study and his graded exercise treadmill test with him in detail.    ASSESSMENT:    No diagnosis found.   PLAN:  Mr. Alphonsa Brickle is a 42 year old Caucasian male who has a history of palpitations. Remotely, A Holter monitor at that time did not show any significant abnormality.  He believes this was done in the [place] area.  Prior to seeing him initially he had noticed that his palpitations  were stress mediated and also were affected by caffeine use.  With complete elimination of caffeine these have resolved but still occur particularly during stressful periods.  Of note, during a 2 week holiday from work, he did not experience any palpitations.  There is no history to suggest sleep apnea.  His blood pressure is on the low side and stable.  There is no history of chest tightness or anginal type symptomatology.  I reviewed his echo Doppler study, which is entirely normal.  In addition, he underwent routine treadmill testing.  He was noted to have PVCs during stress predominantly unifocal but there were reported an occasional couplet but no sustained arrhythmias.  I suspect his palpitations are catecholamine mediated.  On one occasion when he took the metoprolol that I had prescribed.  He did note significant resolution of symptoms after approximately 15-20 minutes.  Clinically he is stable without structural heart disease.  I reviewed laboratory.  TSH is normal.  He is not anemic.  He did not have electrolyte abnormalities.  Present, the not leave further cardiac evaluation is indicated.  He is aware of the ramifications of increased stress and  caffeine.  I will be available to see him on an as-needed basis if future problems arise.    Medication Adjustments/Labs and Tests Ordered: Current medicines are reviewed at length with the patient today.  Concerns regarding medicines are outlined above.  Medication changes, Labs  and Tests ordered today are listed in the Patient Instructions below. There are no Patient Instructions on file for this visit.   Signed, Shelva Majestic, MD  04/22/2017 2:14 PM    Jason English [address] [place], [place], [place]  [address] Phone: [phone]

## 2017-04-24 ENCOUNTER — Encounter: Payer: Self-pay | Admitting: Cardiovascular Disease

## 2018-11-19 IMAGING — US US SCROTUM W/ DOPPLER COMPLETE
1 series · 14 of 25 positions shown · non-contrast
Comparison: None.

CLINICAL DATA: Initial evaluation for left testicular mass.

EXAM:
SCROTAL ULTRASOUND
DOPPLER ULTRASOUND OF THE TESTICLES
TECHNIQUE: Complete ultrasound examination of the testicles, epididymis, and
other scrotal structures was performed. Color and spectral Doppler
ultrasound were also utilized to evaluate blood flow to the
testicles.

[Series 1: us scrotum w/ doppler complete · 0.06mm/px · 14 of 52 slices shown]
[im 1/52]
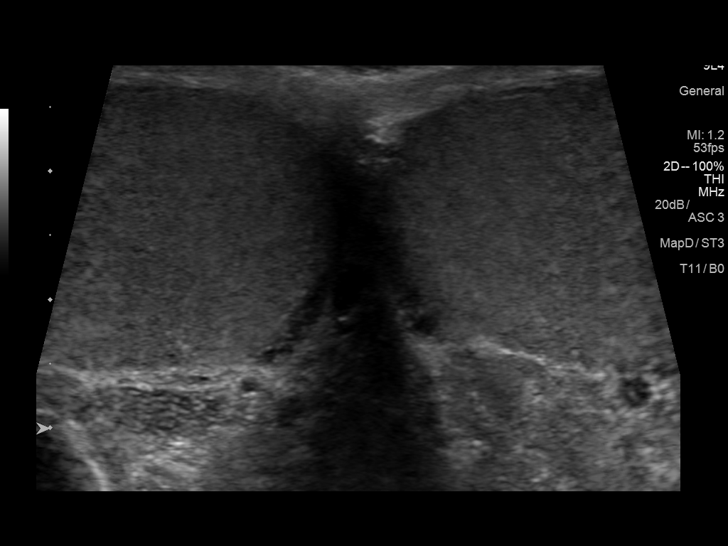
[im 5/52]
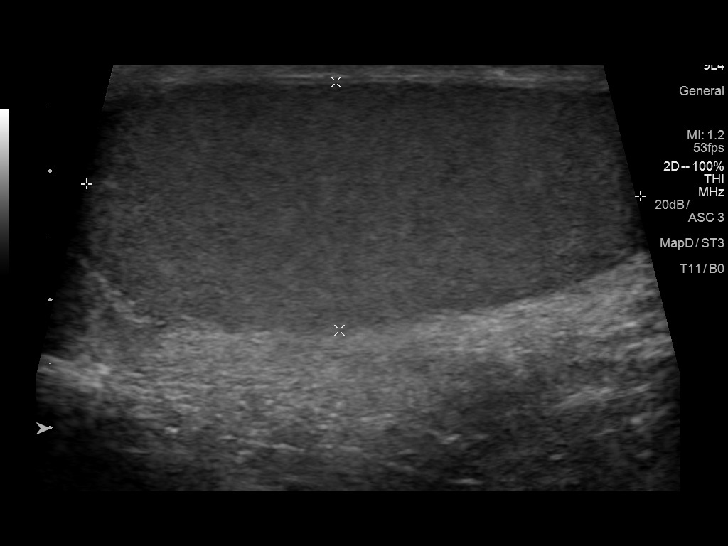
[im 9/52]
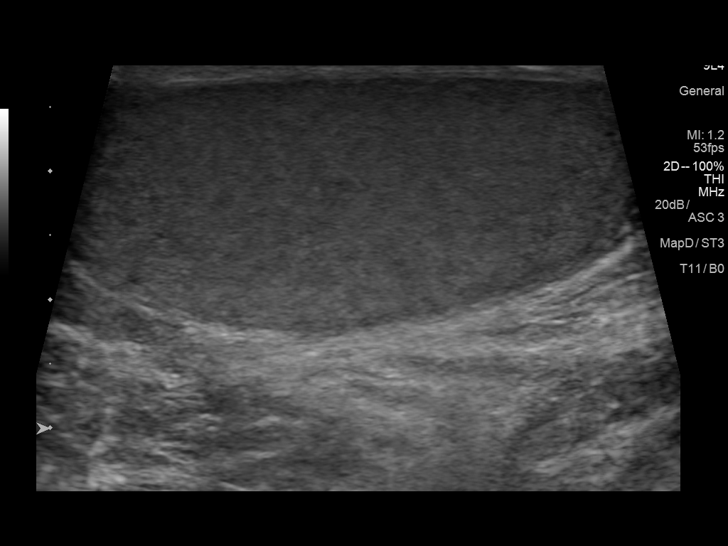
[im 13/52]
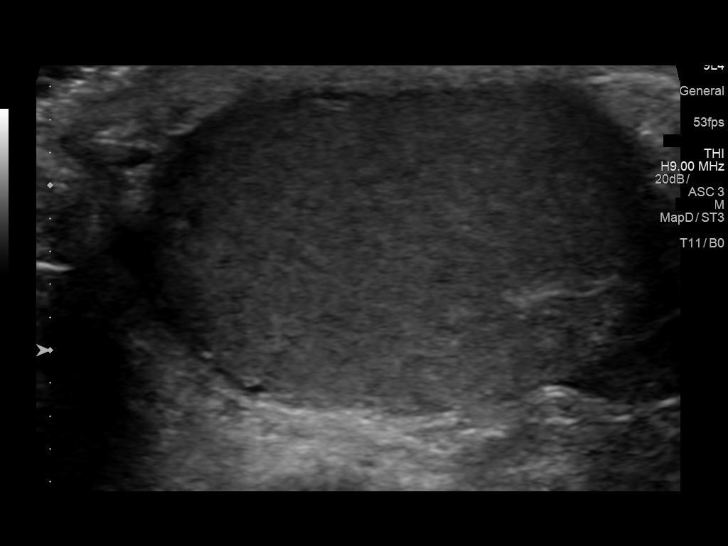
[im 18/52]
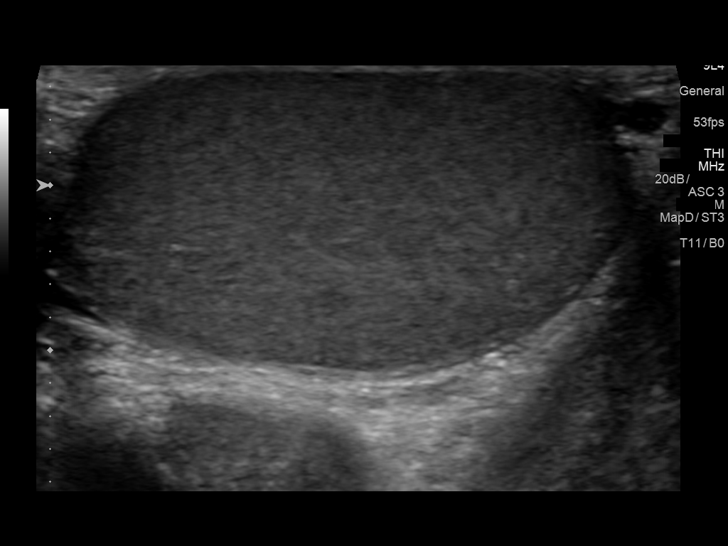
[im 20/52]
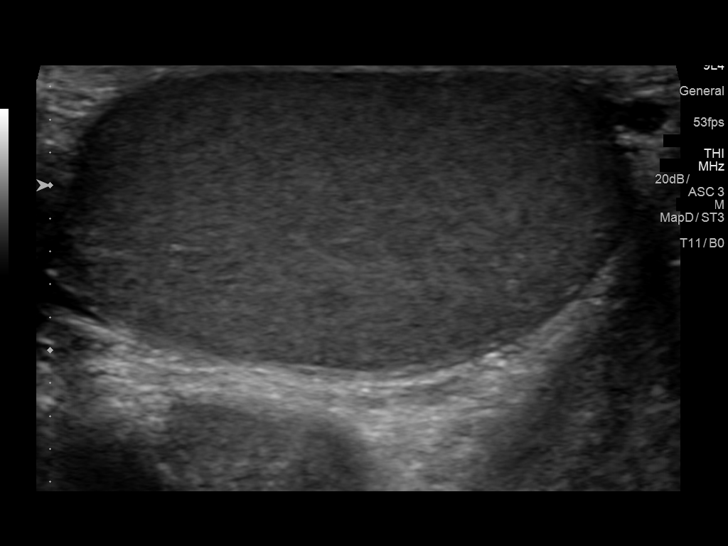
[im 24/52]
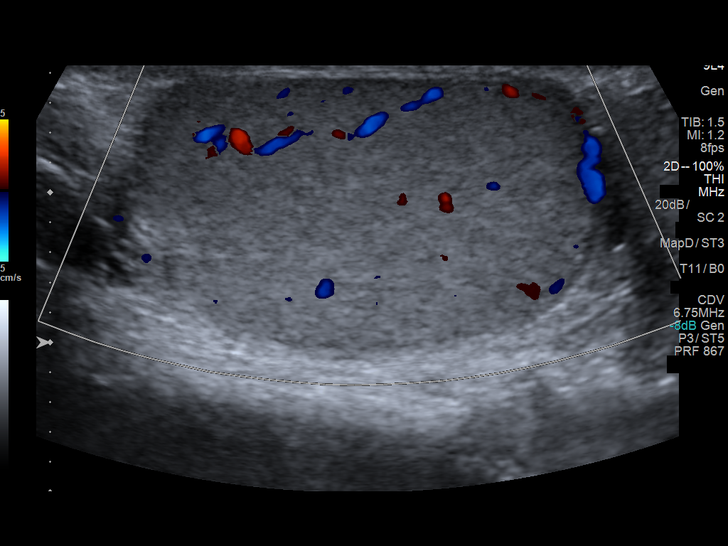
[im 28/52]
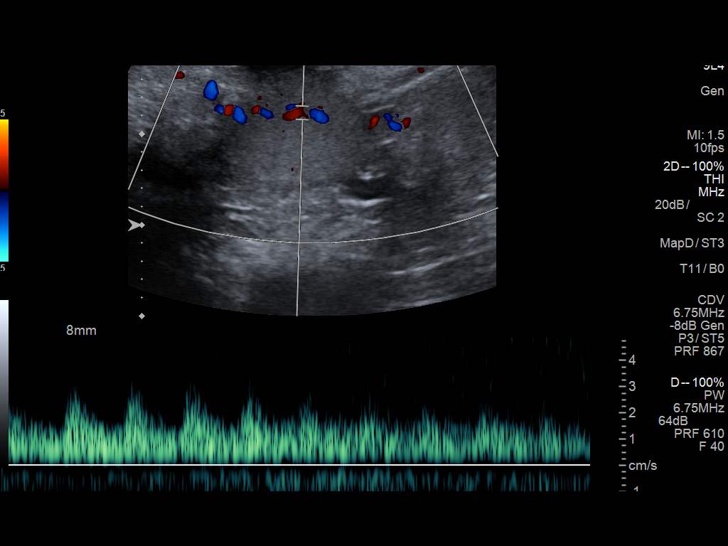
[im 32/52]
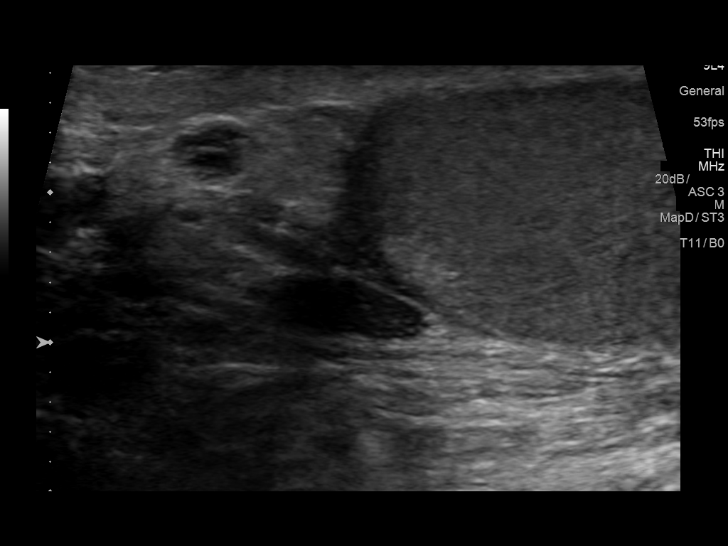
[im 35/52]
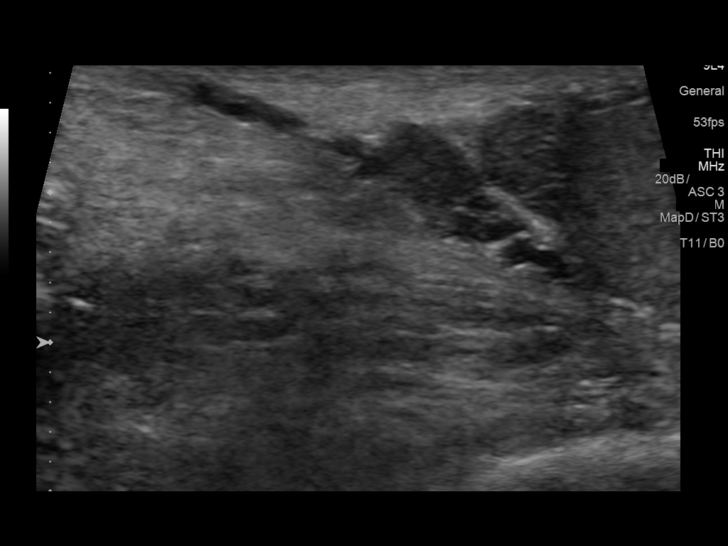
[im 39/52]
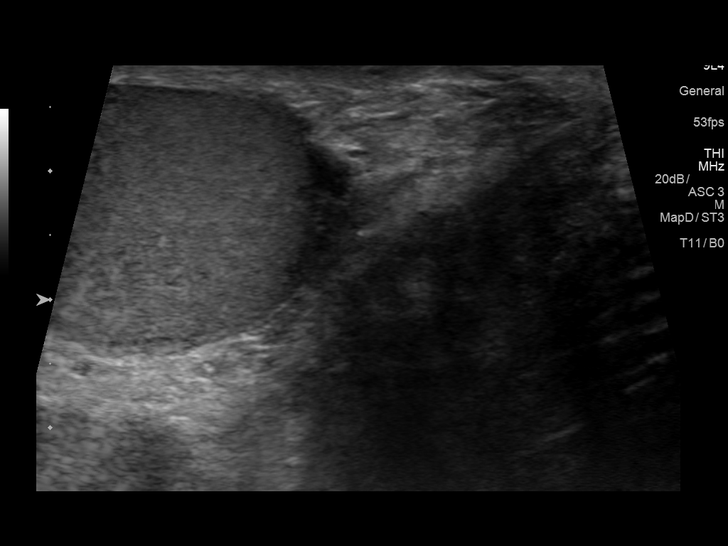
[im 43/52]
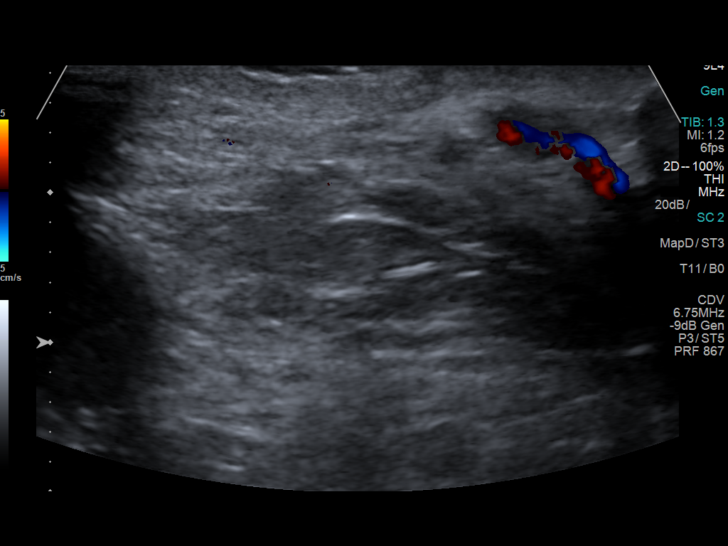
[im 47/52]
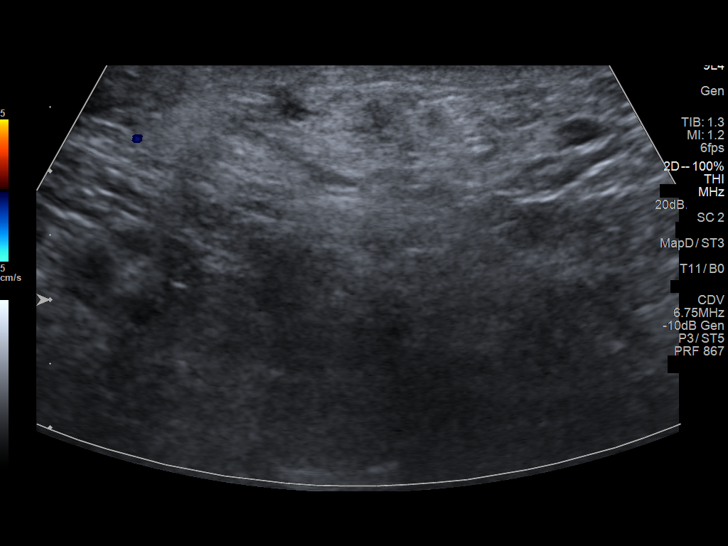
[im 52/52]
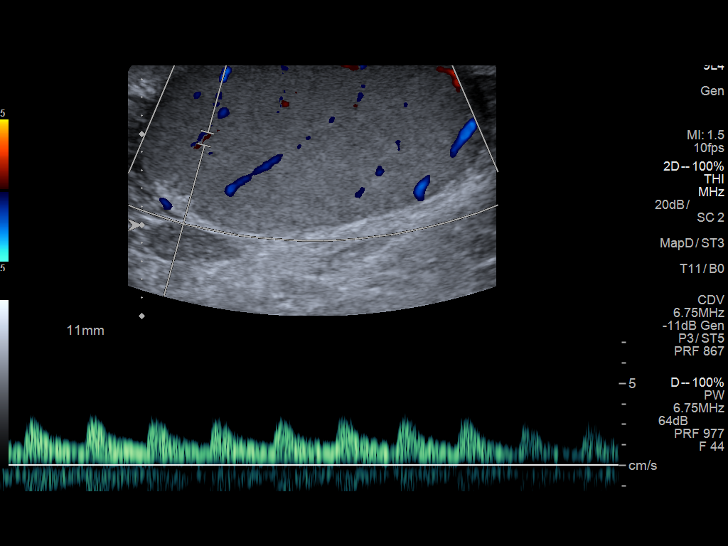

[14 of 25 positions shown; findings below may reference images not displayed]

FINDINGS: Right testicle

Measurements: 4.3 x 1.9 x 2.9 cm. No mass or microlithiasis
visualized.

Left testicle

Measurements: 3.5 x 1.8 x 2.9 cm. No mass or microlithiasis
visualized.

Right epididymis:  Normal in size and appearance.

Left epididymis:  Normal in size and appearance.

Hydrocele:  None visualized.

Varicocele:  None visualized.

Pulsed Doppler interrogation of both testes demonstrates normal low
resistance arterial and venous waveforms bilaterally.
IMPRESSION: Normal testicular ultrasound. No mass lesion or other abnormality
identified.
